# Patient Record
Sex: Female | Born: 1995 | Race: Black or African American | Hispanic: No | Marital: Single | Smoking: Never smoker
Health system: Southern US, Community
[De-identification: ages and names within clinical notes are randomized; demographics above are authoritative.]

## PROBLEM LIST (undated history)

## (undated) DIAGNOSIS — J45909 Unspecified asthma, uncomplicated: Secondary | ICD-10-CM

## (undated) DIAGNOSIS — J302 Other seasonal allergic rhinitis: Secondary | ICD-10-CM

---

## 2013-09-26 ENCOUNTER — Encounter (HOSPITAL_COMMUNITY): Payer: Self-pay | Admitting: Emergency Medicine

## 2013-09-26 DIAGNOSIS — Z79899 Other long term (current) drug therapy: Secondary | ICD-10-CM | POA: Insufficient documentation

## 2013-09-26 DIAGNOSIS — IMO0002 Reserved for concepts with insufficient information to code with codable children: Secondary | ICD-10-CM | POA: Insufficient documentation

## 2013-09-26 DIAGNOSIS — N3090 Cystitis, unspecified without hematuria: Secondary | ICD-10-CM | POA: Insufficient documentation

## 2013-09-26 DIAGNOSIS — J45909 Unspecified asthma, uncomplicated: Secondary | ICD-10-CM | POA: Insufficient documentation

## 2013-09-26 DIAGNOSIS — N390 Urinary tract infection, site not specified: Secondary | ICD-10-CM | POA: Insufficient documentation

## 2013-09-26 DIAGNOSIS — Z3202 Encounter for pregnancy test, result negative: Secondary | ICD-10-CM | POA: Insufficient documentation

## 2013-09-26 LAB — COMPREHENSIVE METABOLIC PANEL
ALBUMIN: 4 g/dL (ref 3.5–5.2)
ALT: 17 U/L (ref 0–35)
AST: 20 U/L (ref 0–37)
Alkaline Phosphatase: 56 U/L (ref 39–117)
BUN: 8 mg/dL (ref 6–23)
CALCIUM: 9.4 mg/dL (ref 8.4–10.5)
CO2: 28 mEq/L (ref 19–32)
Chloride: 103 mEq/L (ref 96–112)
Creatinine, Ser: 0.73 mg/dL (ref 0.50–1.10)
GFR calc Af Amer: >90 mL/min (ref >90)
GFR calc non Af Amer: >90 mL/min (ref >90)
Glucose, Bld: 113 mg/dL — ABNORMAL HIGH (ref 70–99)
Potassium: 3.7 mEq/L (ref 3.7–5.3)
SODIUM: 142 meq/L (ref 137–147)
TOTAL PROTEIN: 7.1 g/dL (ref 6.0–8.3)
Total Bilirubin: 0.6 mg/dL (ref 0.3–1.2)

## 2013-09-26 LAB — URINE MICROSCOPIC-ADD ON

## 2013-09-26 LAB — URINALYSIS, ROUTINE W REFLEX MICROSCOPIC
Bilirubin Urine: NEGATIVE
Glucose, UA: NEGATIVE mg/dL
Ketones, ur: NEGATIVE mg/dL
NITRITE: NEGATIVE
Protein, ur: >300 mg/dL — AB
SPECIFIC GRAVITY, URINE: 1.019 (ref 1.005–1.030)
UROBILINOGEN UA: 1 mg/dL (ref 0.0–1.0)
pH: 6.5 (ref 5.0–8.0)

## 2013-09-26 LAB — CBC WITH DIFFERENTIAL/PLATELET
BASOS ABS: 0 10*3/uL (ref 0.0–0.1)
Basophils Relative: 0 % (ref 0–1)
EOS PCT: 2 % (ref 0–5)
Eosinophils Absolute: 0.2 10*3/uL (ref 0.0–0.7)
HCT: 37.8 % (ref 36.0–46.0)
Hemoglobin: 12.4 g/dL (ref 12.0–15.0)
LYMPHS PCT: 14 % (ref 12–46)
Lymphs Abs: 1.1 10*3/uL (ref 0.7–4.0)
MCH: 27.9 pg (ref 26.0–34.0)
MCHC: 32.8 g/dL (ref 30.0–36.0)
MCV: 84.9 fL (ref 78.0–100.0)
Monocytes Absolute: 0.6 10*3/uL (ref 0.1–1.0)
Monocytes Relative: 7 % (ref 3–12)
Neutro Abs: 6 10*3/uL (ref 1.7–7.7)
Neutrophils Relative %: 76 % (ref 43–77)
PLATELETS: 450 10*3/uL — AB (ref 150–400)
RBC: 4.45 MIL/uL (ref 3.87–5.11)
RDW: 12.7 % (ref 11.5–15.5)
WBC: 7.9 10*3/uL (ref 4.0–10.5)

## 2013-09-26 LAB — POC URINE PREG, ED: Preg Test, Ur: NEGATIVE

## 2013-09-26 NOTE — ED Notes (Signed)
Pt. reports dysuria/hematuria with low abdominal cramping onset today , denies nausea/vomitting or diarrhea . No fever or chills.

## 2013-09-27 ENCOUNTER — Emergency Department (HOSPITAL_COMMUNITY)
Admission: EM | Admit: 2013-09-27 | Discharge: 2013-09-27 | Disposition: A | Discharge status: Home / Self Care | Payer: Self-pay | Attending: Emergency Medicine | Admitting: Emergency Medicine

## 2013-09-27 DIAGNOSIS — N390 Urinary tract infection, site not specified: Secondary | ICD-10-CM

## 2013-09-27 DIAGNOSIS — N3091 Cystitis, unspecified with hematuria: Secondary | ICD-10-CM

## 2013-09-27 HISTORY — DX: Unspecified asthma, uncomplicated: J45.909

## 2013-09-27 HISTORY — DX: Other seasonal allergic rhinitis: J30.2

## 2013-09-27 MED ORDER — CEPHALEXIN 250 MG PO CAPS
500.0000 mg | ORAL_CAPSULE | Freq: Once | ORAL | Status: AC
  Administered 2013-09-27: 500 mg via ORAL
  Filled 2013-09-27: qty 2

## 2013-09-27 MED ORDER — CEPHALEXIN 500 MG PO CAPS: 500.0000 mg | ORAL_CAPSULE | Freq: Two times a day (BID) | ORAL | Status: DC

## 2013-09-27 NOTE — ED Provider Notes (Signed)
Medical screening examination/treatment/procedure(s) were performed by non-physician practitioner and as supervising physician I was immediately available for consultation/collaboration.   EKG Interpretation None        Stevana Dufner L Berthe Oley, MD 09/27/13 0659 

## 2013-09-27 NOTE — ED Provider Notes (Signed)
CSN: [id number]     Arrival date & time 09/26/13  2017 History   First MD Initiated Contact with Patient 09/27/13 0141     Chief Complaint  Patient presents with  . Dysuria     (Consider location/radiation/quality/duration/timing/severity/associated sxs/prior Treatment) HPI Comments: Patient is a 18 year old female with no significant past medical history who presents today for dysuria and hematuria with onset yesterday afternoon. Patient has been taking probiotics and drinking cranberry juice without relief. She states that she experiences burning discomfort at the "end of the urethra" with each void. She states that she noticed blood in her urine at symptom onset which has persisted without clots. Symptoms associated with suprapubic cramping which is intermittent and nonradiating. Patient denies associated fever, back pain, nausea, vomiting, diarrhea, vaginal bleeding, vaginal discharge, pelvic pain, and chills. LMP 09/07/2013.  Patient is a 18 y.o. female presenting with dysuria. The history is provided by the patient. No language interpreter was used.  Dysuria Associated symptoms: abdominal pain (suprapubic)   Associated symptoms: no fever, no flank pain, no nausea and no vomiting     Past Medical History  Diagnosis Date  . Asthma   . Seasonal allergies    History reviewed. No pertinent past surgical history. No family history on file. History  Substance Use Topics  . Smoking status: Never Smoker   . Smokeless tobacco: Not on file  . Alcohol Use: No   OB History   Grav Para Term Preterm Abortions TAB SAB Ect Mult Living                 Review of Systems  Constitutional: Negative for fever.  Gastrointestinal: Positive for abdominal pain (suprapubic). Negative for nausea, vomiting and diarrhea.  Genitourinary: Positive for dysuria and hematuria. Negative for flank pain.  Musculoskeletal: Negative for back pain.  All other systems reviewed and are  negative.      Allergies  Review of patient's allergies indicates no known allergies.  Home Medications   Current Outpatient Rx  Name  Route  Sig  Dispense  Refill  . albuterol (PROVENTIL HFA;VENTOLIN HFA) 108 (90 BASE) MCG/ACT inhaler   Inhalation   Inhale 2 puffs into the lungs every 6 (six) hours as needed for wheezing or shortness of breath.         Marland Kitchen. HYDROCORTISONE PO   Oral   Take 1 application by mouth 2 (two) times daily. ECZEMA         . cephALEXin (KEFLEX) 500 MG capsule   Oral   Take 1 capsule (500 mg total) by mouth 2 (two) times daily.   14 capsule   0    BP 116/73  Pulse 75  Temp(Src) 98.9 F (37.2 C) (Oral)  Resp 16  Ht 5\' 7"  (1.702 m)  Wt 132 lb (59.875 kg)  BMI 20.67 kg/m2  SpO2 100%  LMP 09/07/2013  Physical Exam  Nursing note and vitals reviewed. Constitutional: She is oriented to person, place, and time. She appears well-developed and well-nourished. No distress.  Patient nontoxic and nonseptic appearing. She is in no visible or audible discomfort.  HENT:  Head: Normocephalic and atraumatic.  Mouth/Throat: Oropharynx is clear and moist. No oropharyngeal exudate.  Eyes: Conjunctivae and EOM are normal. No scleral icterus.  Neck: Normal range of motion.  Cardiovascular: Normal rate, regular rhythm and normal heart sounds.   Pulmonary/Chest: Effort normal and breath sounds normal. No respiratory distress. She has no wheezes. She has no rales.  Abdominal: Soft. She exhibits no  distension. There is tenderness (mild with deep palpation to suprapubic region). There is no rebound and no guarding.  No peritoneal signs. No CVA tenderness.  Musculoskeletal: Normal range of motion.  Neurological: She is alert and oriented to person, place, and time.  GCS 15. Speech is goal oriented.  Skin: Skin is warm and dry. No rash noted. She is not diaphoretic. No erythema. No pallor.  Psychiatric: She has a normal mood and affect. Her behavior is normal.     ED Course  Procedures (including critical care time) Labs Review Labs Reviewed  URINALYSIS, ROUTINE W REFLEX MICROSCOPIC - Abnormal; Notable for the following:    APPearance CLOUDY (*)    Hgb urine dipstick LARGE (*)    Protein, ur >300 (*)    Leukocytes, UA MODERATE (*)    All other components within normal limits  CBC WITH DIFFERENTIAL - Abnormal; Notable for the following:    Platelets 450 (*)    All other components within normal limits  COMPREHENSIVE METABOLIC PANEL - Abnormal; Notable for the following:    Glucose, Bld 113 (*)    All other components within normal limits  URINE MICROSCOPIC-ADD ON - Abnormal; Notable for the following:    Squamous Epithelial / LPF FEW (*)    All other components within normal limits  URINE CULTURE  POC URINE PREG, ED   Imaging Review No results found.   EKG Interpretation None      MDM   Final diagnoses:  UTI (urinary tract infection)  Hemorrhagic cystitis    Pt has been diagnosed with a UTI. Symptom onset yesterday afternoon. Pt is afebrile, no CVA tenderness, normotensive, and denies N/V and vaginal complaints. No evidence of peritonitis. No leukocytosis. Kidney function preserved. Pt to be dc home with antibiotics and instructions to follow up with PCP if symptoms persist. She has been given her first dose of abx in ED prior to d/c.   Filed Vitals:   09/26/13 2043 09/27/13 0107  BP: 120/64 116/73  Pulse: 96 75  Temp: 99.3 F (37.4 C) 98.9 F (37.2 C)  TempSrc: Oral Oral  Resp: 14 16  Height: 5\' 7"  (1.702 m)   Weight: 132 lb (59.875 kg)   SpO2: 100% 100%        Antony Madura, PA-C 09/27/13 0207

## 2013-09-27 NOTE — Discharge Instructions (Signed)
Urinary Tract Infection  Urinary tract infections (UTIs) can develop anywhere along your urinary tract. Your urinary tract is your body's drainage system for removing wastes and extra water. Your urinary tract includes two kidneys, two ureters, a bladder, and a urethra. Your kidneys are a pair of bean-shaped organs. Each kidney is about the size of your fist. They are located below your ribs, one on each side of your spine.  CAUSES  Infections are caused by microbes, which are microscopic organisms, including fungi, viruses, and bacteria. These organisms are so small that they can only be seen through a microscope. Bacteria are the microbes that most commonly cause UTIs.  SYMPTOMS   Symptoms of UTIs may vary by age and gender of the patient and by the location of the infection. Symptoms in young women typically include a frequent and intense urge to urinate and a painful, burning feeling in the bladder or urethra during urination. Older women and men are more likely to be tired, shaky, and weak and have muscle aches and abdominal pain. A fever may mean the infection is in your kidneys. Other symptoms of a kidney infection include pain in your back or sides below the ribs, nausea, and vomiting.  DIAGNOSIS  To diagnose a UTI, your caregiver will ask you about your symptoms. Your caregiver also will ask to provide a urine sample. The urine sample will be tested for bacteria and white blood cells. White blood cells are made by your body to help fight infection.  TREATMENT   Typically, UTIs can be treated with medication. Because most UTIs are caused by a bacterial infection, they usually can be treated with the use of antibiotics. The choice of antibiotic and length of treatment depend on your symptoms and the type of bacteria causing your infection.  HOME CARE INSTRUCTIONS   If you were prescribed antibiotics, take them exactly as your caregiver instructs you. Finish the medication even if you feel better after you  have only taken some of the medication.   Drink enough water and fluids to keep your urine clear or pale yellow.   Avoid caffeine, tea, and carbonated beverages. They tend to irritate your bladder.   Empty your bladder often. Avoid holding urine for long periods of time.   Empty your bladder before and after sexual intercourse.   After a bowel movement, women should cleanse from front to back. Use each tissue only once.  SEEK MEDICAL CARE IF:    You have back pain.   You develop a fever.   Your symptoms do not begin to resolve within 3 days.  SEEK IMMEDIATE MEDICAL CARE IF:    You have severe back pain or lower abdominal pain.   You develop chills.   You have nausea or vomiting.   You have continued burning or discomfort with urination.  MAKE SURE YOU:    Understand these instructions.   Will watch your condition.   Will get help right away if you are not doing well or get worse.  Document Released: 04/02/2005 Document Revised: 12/23/2011 Document Reviewed: 08/01/2011  [employer] Patient Information 2014 [employer].

## 2013-09-28 LAB — URINE CULTURE: Colony Count: 15000

## 2014-11-28 ENCOUNTER — Emergency Department (HOSPITAL_COMMUNITY): Admission: EM | Admit: 2014-11-28 | Discharge: 2014-11-28 | Discharge status: Against Medical Advice | Payer: Self-pay

## 2014-11-28 ENCOUNTER — Emergency Department (HOSPITAL_COMMUNITY)
Admission: EM | Admit: 2014-11-28 | Discharge: 2014-11-28 | Disposition: A | Discharge status: Home / Self Care | Payer: Self-pay | Attending: Emergency Medicine | Admitting: Emergency Medicine

## 2014-11-28 ENCOUNTER — Encounter (HOSPITAL_COMMUNITY): Payer: Self-pay | Admitting: Physical Medicine and Rehabilitation

## 2014-11-28 DIAGNOSIS — Y9389 Activity, other specified: Secondary | ICD-10-CM | POA: Insufficient documentation

## 2014-11-28 DIAGNOSIS — S01511A Laceration without foreign body of lip, initial encounter: Secondary | ICD-10-CM | POA: Insufficient documentation

## 2014-11-28 DIAGNOSIS — Z79899 Other long term (current) drug therapy: Secondary | ICD-10-CM | POA: Insufficient documentation

## 2014-11-28 DIAGNOSIS — Y998 Other external cause status: Secondary | ICD-10-CM | POA: Insufficient documentation

## 2014-11-28 DIAGNOSIS — J45909 Unspecified asthma, uncomplicated: Secondary | ICD-10-CM | POA: Insufficient documentation

## 2014-11-28 DIAGNOSIS — Y9289 Other specified places as the place of occurrence of the external cause: Secondary | ICD-10-CM | POA: Insufficient documentation

## 2014-11-28 MED ORDER — LIDOCAINE HCL (PF) 1 % IJ SOLN
5.0000 mL | Freq: Once | INTRAMUSCULAR | Status: AC
  Administered 2014-11-28: 5 mL
  Filled 2014-11-28: qty 5
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
## 2014-11-28 NOTE — ED Notes (Signed)
Pt calledx2, no answer 

## 2014-11-28 NOTE — ED Notes (Addendum)
Pt called x1. No answer. 

## 2014-11-28 NOTE — Discharge Instructions (Signed)
Facial Laceration ° A facial laceration is a cut on the face. These injuries can be painful and cause bleeding. Lacerations usually heal quickly, but they need special care to reduce scarring. °DIAGNOSIS  °Your health care provider will take a medical history, ask for details about how the injury occurred, and examine the wound to determine how deep the cut is. °TREATMENT  °Some facial lacerations may not require closure. Others may not be able to be closed because of an increased risk of infection. The risk of infection and the chance for successful closure will depend on various factors, including the amount of time since the injury occurred. °The wound may be cleaned to help prevent infection. If closure is appropriate, pain medicines may be given if needed. Your health care provider will use stitches (sutures), wound glue (adhesive), or skin adhesive strips to repair the laceration. These tools bring the skin edges together to allow for faster healing and a better cosmetic outcome. If needed, you may also be given a tetanus shot. °HOME CARE INSTRUCTIONS °· Only take over-the-counter or prescription medicines as directed by your health care provider. °· Follow your health care provider's instructions for wound care. These instructions will vary depending on the technique used for closing the wound. °For Sutures: °· Keep the wound clean and dry.   °· If you were given a bandage (dressing), you should change it at least once a day. Also change the dressing if it becomes wet or dirty, or as directed by your health care provider.   °· Wash the wound with soap and water 2 times a day. Rinse the wound off with water to remove all soap. Pat the wound dry with a clean towel.   °· After cleaning, apply a thin layer of the antibiotic ointment recommended by your health care provider. This will help prevent infection and keep the dressing from sticking.   °· You may shower as usual after the first 24 hours. Do not soak the  wound in water until the sutures are removed.   °· Get your sutures removed as directed by your health care provider. With facial lacerations, sutures should usually be taken out after 4-5 days to avoid stitch marks.   °· Wait a few days after your sutures are removed before applying any makeup. °For Skin Adhesive Strips: °· Keep the wound clean and dry.   °· Do not get the skin adhesive strips wet. You may bathe carefully, using caution to keep the wound dry.   °· If the wound gets wet, pat it dry with a clean towel.   °· Skin adhesive strips will fall off on their own. You may trim the strips as the wound heals. Do not remove skin adhesive strips that are still stuck to the wound. They will fall off in time.   °For Wound Adhesive: °· You may briefly wet your wound in the shower or bath. Do not soak or scrub the wound. Do not swim. Avoid periods of heavy sweating until the skin adhesive has fallen off on its own. After showering or bathing, gently pat the wound dry with a clean towel.   °· Do not apply liquid medicine, cream medicine, ointment medicine, or makeup to your wound while the skin adhesive is in place. This may loosen the film before your wound is healed.   °· If a dressing is placed over the wound, be careful not to apply tape directly over the skin adhesive. This may cause the adhesive to be pulled off before the wound is healed.   °· Avoid   prolonged exposure to sunlight or tanning lamps while the skin adhesive is in place.  The skin adhesive will usually remain in place for 5-10 days, then naturally fall off the skin. Do not pick at the adhesive film.  After Healing: Once the wound has healed, cover the wound with sunscreen during the day for 1 full year. This can help minimize scarring. Exposure to ultraviolet light in the first year will darken the scar. It can take 1-2 years for the scar to lose its redness and to heal completely.  SEEK IMMEDIATE MEDICAL CARE IF:  You have redness, pain, or  swelling around the wound.   You see ayellowish-white fluid (pus) coming from the wound.   You have chills or a fever.  MAKE SURE YOU:  Understand these instructions.  Will watch your condition.  Will get help right away if you are not doing well or get worse. Document Released: 07/31/2004 Document Revised: 04/13/2013 Document Reviewed: 02/03/2013 [facility] Patient Information 2015 [place], [place]. This information is not intended to replace advice given to you by your health care provider. Make sure you discuss any questions you have with your health care provider.  Mouth Laceration A mouth laceration is a cut inside the mouth. TREATMENT  Because of all the bacteria in the mouth, lacerations are usually not stitched (sutured) unless the wound is gaping open. Sometimes, a couple sutures may be placed just to hold the edges of the wound together and to speed healing. Over the next 1 to 2 days, you will see that the wound edges appear gray in color. The edges may appear ragged and slightly spread apart. Because of all the normal bacteria in the mouth, these wounds are contaminated, but this is not an infection that needs antibiotics. Most wounds heal with no problems despite their appearance. HOME CARE INSTRUCTIONS   Rinse your mouth with a warm, saltwater wash 4 to 6 times per day, or as your caregiver instructs.  Continue oral hygiene and gentle tooth brushing as normal, if possible.  Do not eat or drink hot food or beverages while your mouth is still numb.  Eat a bland diet to avoid irritation from acidic foods.  Only take over-the-counter or prescription medicines for pain, discomfort, or fever as directed by your caregiver.  Follow up with your caregiver as instructed. You may need to see your caregiver for a wound check in 48 to 72 hours to make sure your wound is healing.  If your laceration was sutured, do not play with the sutures or knots with your tongue. If you do this,  they will gradually loosen and may become untied. You may need a tetanus shot if:  You cannot remember when you had your last tetanus shot.  You have never had a tetanus shot. If you get a tetanus shot, your arm may swell, get red, and feel warm to the touch. This is common and not a problem. If you need a tetanus shot and you choose not to have one, there is a rare chance of getting tetanus. Sickness from tetanus can be serious. SEEK MEDICAL CARE IF:   You develop swelling or increasing pain in the wound or in other parts of your face.  You have a fever.  You develop swollen, tender glands in the throat.  You notice the wound edges do not stay together after your sutures have been removed.  You see pus coming from the wound. Some drainage in the mouth is normal. MAKE SURE  YOU:   Understand these instructions.  Will watch your condition.  Will get help right away if you are not doing well or get worse. Document Released: 06/23/2005 Document Revised: 09/15/2011 Document Reviewed: 12/26/2010 [facility] Patient Information 2015 [place], [place]. This information is not intended to replace advice given to you by your health care provider. Make sure you discuss any questions you have with your health care provider.
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
## 2014-12-05 ENCOUNTER — Encounter (HOSPITAL_COMMUNITY): Payer: Self-pay | Admitting: Emergency Medicine

## 2014-12-05 ENCOUNTER — Emergency Department (HOSPITAL_COMMUNITY)
Admission: EM | Admit: 2014-12-05 | Discharge: 2014-12-05 | Disposition: A | Discharge status: Home / Self Care | Payer: Self-pay | Attending: Emergency Medicine | Admitting: Emergency Medicine

## 2014-12-05 DIAGNOSIS — J45909 Unspecified asthma, uncomplicated: Secondary | ICD-10-CM | POA: Insufficient documentation

## 2014-12-05 DIAGNOSIS — Z4802 Encounter for removal of sutures: Secondary | ICD-10-CM | POA: Insufficient documentation

## 2014-12-05 DIAGNOSIS — Z79899 Other long term (current) drug therapy: Secondary | ICD-10-CM | POA: Insufficient documentation

## 2014-12-05 NOTE — ED Notes (Signed)
Pt here to have sutures removed from lip. Wound in well approximated. No signs of infection.

## 2014-12-05 NOTE — ED Provider Notes (Signed)
CSN: [id number]     Arrival date & time 12/05/14  1504 History  This chart was scribed for non-physician practitioner, Emilia Beck, PA-C, working with Rolland Porter, MD, by Lionel December, ED Scribe. This patient was seen in room TR06C/TR06C and the patient's care was started at 3:38 PM.    Chief Complaint  Patient presents with  . Suture / Staple Removal     (Consider location/radiation/quality/duration/timing/severity/associated sxs/prior Treatment) HPI HPI Comments: Kim Adkins is a 19 y.o. female who presents to the Emergency Department for suture removal from the left side of her lip which were put in a week ago.    Patient has no other questions or concerns today.       Past Medical History  Diagnosis Date  . Asthma   . Seasonal allergies    History reviewed. No pertinent past surgical history. No family history on file. History  Substance Use Topics  . Smoking status: Never Smoker   . Smokeless tobacco: Not on file  . Alcohol Use: No   OB History    No data available     Review of Systems  Skin: Positive for wound.  All other systems reviewed and are negative.     Allergies  Review of patient's allergies indicates no known allergies.  Home Medications   Prior to Admission medications   Medication Sig Start Date End Date Taking? Authorizing Provider  albuterol (PROVENTIL HFA;VENTOLIN HFA) 108 (90 BASE) MCG/ACT inhaler Inhale 2 puffs into the lungs every 6 (six) hours as needed for wheezing or shortness of breath.    Historical Provider, MD   BP 99/61 mmHg  Pulse 62  Temp(Src) 98 F (36.7 C) (Oral)  Resp 16  Ht  (1.702 m)  Wt 139 lb (63.05 kg)  BMI 21.77 kg/m2  SpO2 98%  LMP 10/25/2014 Physical Exam  Constitutional: She is oriented to person, place, and time. She appears well-developed and well-nourished. No distress.  HENT:  Head: Normocephalic and atraumatic.  Eyes: Conjunctivae and EOM are normal.  Cardiovascular: Normal rate and  regular rhythm.  Exam reveals no gallop and no friction rub.   No murmur heard. Pulmonary/Chest: Effort normal and breath sounds normal. No respiratory distress. She has no wheezes. She has no rales. She exhibits no tenderness.  Abdominal: Soft. There is no tenderness.  Musculoskeletal: Normal range of motion.  Neurological: She is alert and oriented to person, place, and time.  Speech is goal-oriented. Moves limbs without ataxia.   Skin: Skin is warm and dry.  Healed laceration above left upper lip with sutures intact.   Psychiatric: She has a normal mood and affect. Her behavior is normal.  Nursing note and vitals reviewed.   ED Course  Procedures (including critical care time) DIAGNOSTIC STUDIES: Oxygen Saturation is 98% on RA, normal by my interpretation.    COORDINATION OF CARE: 3:41 PM Discussed treatment plan with patient at beside, the patient agrees with the plan and has no further questions at this time.  SUTURE REMOVAL Performed by: Emilia Beck  Consent: Verbal consent obtained. Patient identity confirmed: provided demographic data Time out: Immediately prior to procedure a "time out" was called to verify the correct patient, procedure, equipment, support staff and site/side marked as required.  Location details: above left upper lip  Wound Appearance: clean  Sutures/Staples Removed: 2  Facility: sutures placed in this facility Patient tolerance: Patient tolerated the procedure well with no immediate complications.     Labs Review Labs Reviewed - No  data to display  Imaging Review No results found.   EKG Interpretation None      MDM   Final diagnoses:  Visit for suture removal   3:43 PM Sutures removed without difficulty.   I personally performed the services described in this documentation, which was scribed in my presence. The recorded information has been reviewed and is accurate.    Emilia BeckKaitlyn Jadin Kagel, PA-C 12/05/14 1544  Rolland PorterMark  James, MD 12/13/14 [phone]

## 2015-05-28 ENCOUNTER — Emergency Department (HOSPITAL_COMMUNITY)
Admission: EM | Admit: 2015-05-28 | Discharge: 2015-05-28 | Disposition: A | Discharge status: Home / Self Care | Payer: No Typology Code available for payment source | Attending: Emergency Medicine | Admitting: Emergency Medicine

## 2015-05-28 ENCOUNTER — Encounter (HOSPITAL_COMMUNITY): Payer: Self-pay | Admitting: *Deleted

## 2015-05-28 DIAGNOSIS — Z79899 Other long term (current) drug therapy: Secondary | ICD-10-CM | POA: Diagnosis not present

## 2015-05-28 DIAGNOSIS — Y92410 Unspecified street and highway as the place of occurrence of the external cause: Secondary | ICD-10-CM | POA: Diagnosis not present

## 2015-05-28 DIAGNOSIS — S199XXA Unspecified injury of neck, initial encounter: Secondary | ICD-10-CM | POA: Diagnosis not present

## 2015-05-28 DIAGNOSIS — Y998 Other external cause status: Secondary | ICD-10-CM | POA: Insufficient documentation

## 2015-05-28 DIAGNOSIS — Y9389 Activity, other specified: Secondary | ICD-10-CM | POA: Insufficient documentation

## 2015-05-28 DIAGNOSIS — J45909 Unspecified asthma, uncomplicated: Secondary | ICD-10-CM | POA: Insufficient documentation

## 2015-05-28 MED ORDER — IBUPROFEN 400 MG PO TABS: 400.0000 mg | ORAL_TABLET | Freq: Four times a day (QID) | ORAL | Status: AC | PRN
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
## 2015-10-26 ENCOUNTER — Emergency Department (HOSPITAL_COMMUNITY)
Admission: EM | Admit: 2015-10-26 | Discharge: 2015-10-26 | Disposition: A | Discharge status: Home / Self Care | Payer: No Typology Code available for payment source | Attending: Emergency Medicine | Admitting: Emergency Medicine

## 2015-10-26 ENCOUNTER — Encounter (HOSPITAL_COMMUNITY): Payer: Self-pay | Admitting: Emergency Medicine

## 2015-10-26 DIAGNOSIS — Z79899 Other long term (current) drug therapy: Secondary | ICD-10-CM | POA: Insufficient documentation

## 2015-10-26 DIAGNOSIS — L03114 Cellulitis of left upper limb: Secondary | ICD-10-CM

## 2015-10-26 DIAGNOSIS — J45909 Unspecified asthma, uncomplicated: Secondary | ICD-10-CM | POA: Insufficient documentation

## 2015-10-26 MED ORDER — CEPHALEXIN 500 MG PO CAPS: 500.0000 mg | ORAL_CAPSULE | Freq: Two times a day (BID) | ORAL | Status: AC

## 2015-10-26 NOTE — ED Provider Notes (Signed)
CSN: [id number]     Arrival date & time 10/26/15  2242 History  By signing my name below, I, Linus GalasMaharshi Patel, attest that this documentation has been prepared under the direction and in the presence of Audry Piliyler Kunal Levario, PA-C. Electronically Signed: Linus GalasMaharshi Patel, ED Scribe. 10/26/2015. 11:07 PM.  Chief Complaint  Patient presents with  . Insect Bite   The history is provided by the patient. No language interpreter was used.   HPI Comments: Kim Adkins is a [age] y.o. female who presents to the Emergency Department complaining of insect bite to her left forearm that was noted yesterday. She notes increased redness and itching since. Pt notes pain with movement. None now. She applied OTC topical Benadryl with no relief. Pt denies any fevers, chills, N/V/D or any other symptoms at this time.   Past Medical History  Diagnosis Date  . Asthma   . Seasonal allergies    History reviewed. No pertinent past surgical history. History reviewed. No pertinent family history. Social History  Substance Use Topics  . Smoking status: Never Smoker   . Smokeless tobacco: None  . Alcohol Use: Yes     Comment: social   OB History    No data available     Review of Systems A complete 10 system review of systems was obtained and all systems are negative except as noted in the HPI and PMH.   Allergies  Lactose intolerance (gi)  Home Medications   Prior to Admission medications   Medication Sig Start Date End Date Taking? Authorizing Provider  albuterol (PROVENTIL HFA;VENTOLIN HFA) 108 (90 BASE) MCG/ACT inhaler Inhale 2 puffs into the lungs every 6 (six) hours as needed for wheezing or shortness of breath.    Historical Provider, MD  aspirin-acetaminophen-caffeine (EXCEDRIN MIGRAINE) [phone] MG tablet Take 1 tablet by mouth every 6 (six) hours as needed for headache.    Historical Provider, MD  ibuprofen (ADVIL,MOTRIN) 400 MG tablet Take 1 tablet (400 mg total) by mouth every 6 (six) hours as needed.  05/28/15   Joycie PeekBenjamin Cartner, PA-C   BP 112/73 mmHg  Pulse 64  Temp(Src) 98.3 F (36.8 C) (Oral)  Resp 18  Ht 5\' 7"  (1.702 m)  Wt 150 lb (68.04 kg)  BMI 23.49 kg/m2  SpO2 100%   Physical Exam  Constitutional: She is oriented to person, place, and time. She appears well-developed and well-nourished.  HENT:  Head: Normocephalic and atraumatic.  Eyes: EOM are normal.  Neck: Normal range of motion.  Cardiovascular: Normal rate and regular rhythm.   Pulmonary/Chest: Effort normal.  Abdominal: Soft. She exhibits no distension.  Musculoskeletal: Normal range of motion.  Neurological: She is alert and oriented to person, place, and time.  Skin: Skin is warm and dry.  Cellulitic rash noted on left forearm. <21mm site of induration noted. Blanchable. No signs of purulent drainage.    Psychiatric: She has a normal mood and affect. Her behavior is normal. Thought content normal.  Nursing note and vitals reviewed.  ED Course  Procedures  DIAGNOSTIC STUDIES: Oxygen Saturation is 100% on room air, normal by my interpretation.    COORDINATION OF CARE: 11:03 PM Discussed treatment plan with pt at bedside and pt agreed to plan.  Labs Review Labs Reviewed - No data to display  Imaging Review No results found. I have personally reviewed and evaluated these images and lab results as part of my medical decision-making.   EKG Interpretation None      MDM  I have reviewed  the relevant previous healthcare records. I obtained HPI from historian.  ED Course:  Assessment: Pt is a 20yF who presents with cellulitic rash on left forearm. On exam, pt in NAD. Nontoxic/nonseptic appearing. VSS. Afebrile. Plan is to DC home with abx. Counseled on warm compresses to affected area. DC with follow up with PCP for further management x 1 week. At time of discharge, Patient is in no acute distress. Vital Signs are stable. Patient is able to ambulate. Patient able to tolerate PO.   Disposition/Plan:  DC  Home Additional Verbal discharge instructions given and discussed with patient.  Pt Instructed to f/u with PCP in the next week for evaluation and treatment of symptoms. Return precautions given Pt acknowledges and agrees with plan  Supervising Physician Loren Racer, MD   Final diagnoses:  Cellulitis of left upper extremity   I personally performed the services described in this documentation, which was scribed in my presence. The recorded information has been reviewed and is accurate.    Audry Pili, PA-C 10/26/15 2313  Loren Racer, MD 10/27/15 2350

## 2015-10-26 NOTE — ED Notes (Signed)
Pt presents to ED for "bug bite" on left forearm that has begun to develop cellulitis.  Pt c/o tingling to the arm as well.  Insect bite occurred yesterday.  Area of redness covers approx 2x4in area.

## 2015-10-26 NOTE — ED Notes (Signed)
Pt is afebrile.

## 2015-10-26 NOTE — Discharge Instructions (Signed)
Please read and follow all provided instructions.  Your diagnoses today include:  1. Cellulitis of left upper extremity    Tests performed today include:  Vital signs. See below for your results today.   Medications prescribed:   Take any prescribed medications only as directed.   Home care instructions:  Follow any educational materials contained in this packet. Keep affected area above the level of your heart when possible. Wash area gently twice a day with warm soapy water. Do not apply alcohol or hydrogen peroxide. Cover the area if it draining or weeping.   Follow-up instructions:  Return to the Emergency Department in 48 hours for a recheck if your symptoms are not significantly improved.   Please follow-up with your primary care provider in the next 1 week for further evaluation of your symptoms.   Return instructions:  Return to the Emergency Department if you have:  Fever  Worsening symptoms  Worsening pain  Worsening swelling  Redness of the skin that moves away from the affected area, especially if it streaks away from the affected area   Any other emergent concerns  Your vital signs today were: BP 112/73 mmHg   Pulse 64   Temp(Src) 98.3 F (36.8 C) (Oral)   Resp 18   Ht 5\' 7"  (1.702 m)   Wt 68.04 kg   BMI 23.49 kg/m2   SpO2 100% If your blood pressure (BP) was elevated above 135/85 this visit, please have this repeated by your doctor within one month. --------------

## 2015-10-26 NOTE — ED Notes (Signed)
PA at bedside.

## 2015-10-26 NOTE — ED Notes (Signed)
Pt ambulatory and independent at discharge.  

## 2016-03-03 ENCOUNTER — Encounter (HOSPITAL_COMMUNITY): Payer: Self-pay | Admitting: *Deleted

## 2016-03-03 DIAGNOSIS — R102 Pelvic and perineal pain: Secondary | ICD-10-CM | POA: Insufficient documentation

## 2016-03-03 DIAGNOSIS — N760 Acute vaginitis: Secondary | ICD-10-CM | POA: Insufficient documentation

## 2016-03-03 DIAGNOSIS — J45909 Unspecified asthma, uncomplicated: Secondary | ICD-10-CM | POA: Insufficient documentation

## 2016-03-03 DIAGNOSIS — B9689 Other specified bacterial agents as the cause of diseases classified elsewhere: Secondary | ICD-10-CM | POA: Insufficient documentation

## 2016-03-03 DIAGNOSIS — Z79899 Other long term (current) drug therapy: Secondary | ICD-10-CM | POA: Insufficient documentation

## 2016-03-03 DIAGNOSIS — Z7982 Long term (current) use of aspirin: Secondary | ICD-10-CM | POA: Insufficient documentation

## 2016-03-03 LAB — URINALYSIS, ROUTINE W REFLEX MICROSCOPIC
Bilirubin Urine: NEGATIVE
GLUCOSE, UA: NEGATIVE mg/dL
HGB URINE DIPSTICK: NEGATIVE
Ketones, ur: NEGATIVE mg/dL
Leukocytes, UA: NEGATIVE
Nitrite: NEGATIVE
PROTEIN: NEGATIVE mg/dL
Specific Gravity, Urine: 1.026 (ref 1.005–1.030)
pH: 6.5 (ref 5.0–8.0)

## 2016-03-03 LAB — CBC
HCT: 42.2 % (ref 36.0–46.0)
Hemoglobin: 12.9 g/dL (ref 12.0–15.0)
MCH: 26.7 pg (ref 26.0–34.0)
MCHC: 30.6 g/dL (ref 30.0–36.0)
MCV: 87.2 fL (ref 78.0–100.0)
PLATELETS: 489 10*3/uL — AB (ref 150–400)
RBC: 4.84 MIL/uL (ref 3.87–5.11)
RDW: 13 % (ref 11.5–15.5)
WBC: 4.6 10*3/uL (ref 4.0–10.5)

## 2016-03-03 LAB — COMPREHENSIVE METABOLIC PANEL
ALT: 15 U/L (ref 14–54)
AST: 20 U/L (ref 15–41)
Albumin: 4.7 g/dL (ref 3.5–5.0)
Alkaline Phosphatase: 65 U/L (ref 38–126)
Anion gap: 5 (ref 5–15)
BUN: 12 mg/dL (ref 6–20)
CO2: 26 mmol/L (ref 22–32)
CREATININE: 0.63 mg/dL (ref 0.44–1.00)
Calcium: 9.9 mg/dL (ref 8.9–10.3)
Chloride: 103 mmol/L (ref 101–111)
GFR calc Af Amer: >60 mL/min (ref >60)
GLUCOSE: 105 mg/dL — AB (ref 65–99)
Potassium: 4.1 mmol/L (ref 3.5–5.1)
Sodium: 134 mmol/L — ABNORMAL LOW (ref 135–145)
Total Bilirubin: 0.6 mg/dL (ref 0.3–1.2)
Total Protein: 7.5 g/dL (ref 6.5–8.1)

## 2016-03-03 LAB — POC URINE PREG, ED: Preg Test, Ur: NEGATIVE

## 2016-03-03 LAB — LIPASE, BLOOD: LIPASE: 32 U/L (ref 11–51)

## 2016-03-03 NOTE — ED Triage Notes (Signed)
Pt c/o abdominal cramping for a week. Pt also c/o spotting and discharge yesterday. LMP July 23. Pt denies n/v/d

## 2016-03-04 ENCOUNTER — Emergency Department (HOSPITAL_COMMUNITY): Payer: Self-pay

## 2016-03-04 ENCOUNTER — Emergency Department (HOSPITAL_COMMUNITY)
Admission: EM | Admit: 2016-03-04 | Discharge: 2016-03-04 | Disposition: A | Discharge status: Home / Self Care | Payer: Self-pay | Attending: Emergency Medicine | Admitting: Emergency Medicine

## 2016-03-04 DIAGNOSIS — B9689 Other specified bacterial agents as the cause of diseases classified elsewhere: Secondary | ICD-10-CM

## 2016-03-04 DIAGNOSIS — R102 Pelvic and perineal pain: Secondary | ICD-10-CM

## 2016-03-04 DIAGNOSIS — N760 Acute vaginitis: Secondary | ICD-10-CM

## 2016-03-04 DIAGNOSIS — R1030 Lower abdominal pain, unspecified: Secondary | ICD-10-CM

## 2016-03-04 LAB — GC/CHLAMYDIA PROBE AMP (~~LOC~~) NOT AT ARMC
CHLAMYDIA, DNA PROBE: NEGATIVE
NEISSERIA GONORRHEA: NEGATIVE

## 2016-03-04 LAB — WET PREP, GENITAL
SPERM: NONE SEEN
TRICH WET PREP: NONE SEEN
YEAST WET PREP: NONE SEEN

## 2016-03-04 MED ORDER — METRONIDAZOLE 500 MG PO TABS: 500.0000 mg | ORAL_TABLET | Freq: Two times a day (BID) | ORAL | 0 refills | Status: AC

## 2016-03-04 MED ORDER — NAPROXEN 500 MG PO TABS: 500.0000 mg | ORAL_TABLET | Freq: Two times a day (BID) | ORAL | 0 refills | Status: AC

## 2016-03-04 NOTE — ED Notes (Signed)
Patient transported to Ultrasound 

## 2016-03-04 NOTE — ED Provider Notes (Signed)
[facility]-EMERGENCY DEPT Provider Note   CSN: [id number] Arrival date & time: 03/03/16  1803     History   Chief Complaint Chief Complaint  Patient presents with  . Abdominal Pain    HPI Kim Adkins is a 20 y.o. female.  HPI Kim Adkins is a 20 y.o. female presents to emergency department complaining of lower abdominal pain. Patient states her pain started several days ago. She reports pain in suprapubic area, right lower quadrant, states pain radiates into her vaginal area. She reports pain with intercourse. She reports some vaginal spotting and white vaginal discharge. Taking ibuprofen for pain with no relief. Denies nausea, vomiting. No urinary symptoms. States has irregular periods, but usually does not have spotting. Not sure of pregnancy. Nothing making her symptoms better. No other complaints.   Past Medical History:  Diagnosis Date  . Asthma   . Seasonal allergies     There are no active problems to display for this patient.   History reviewed. No pertinent surgical history.  OB History    No data available       Home Medications    Prior to Admission medications   Medication Sig Start Date End Date Taking? Authorizing Provider  albuterol (PROVENTIL HFA;VENTOLIN HFA) 108 (90 BASE) MCG/ACT inhaler Inhale 2 puffs into the lungs every 6 (six) hours as needed for wheezing or shortness of breath.    Historical Provider, MD  aspirin-acetaminophen-caffeine (EXCEDRIN MIGRAINE) [phone] MG tablet Take 1 tablet by mouth every 6 (six) hours as needed for headache.    Historical Provider, MD  cephALEXin (KEFLEX) 500 MG capsule Take 1 capsule (500 mg total) by mouth 2 (two) times daily. 10/26/15   Audry Piliyler Mohr, PA-C  ibuprofen (ADVIL,MOTRIN) 400 MG tablet Take 1 tablet (400 mg total) by mouth every 6 (six) hours as needed. 05/28/15   Joycie PeekBenjamin Cartner, PA-C    Family History History reviewed. No pertinent family history.  Social History Social History  Substance Use Topics    . Smoking status: Never Smoker  . Smokeless tobacco: Never Used  . Alcohol use Yes     Comment: social     Allergies   Lactose intolerance (gi)   Review of Systems Review of Systems  Constitutional: Negative for chills and fever.  Respiratory: Negative for cough, chest tightness and shortness of breath.   Cardiovascular: Negative for chest pain, palpitations and leg swelling.  Gastrointestinal: Positive for abdominal pain. Negative for diarrhea, nausea and vomiting.  Genitourinary: Positive for pelvic pain, vaginal bleeding and vaginal discharge. Negative for dysuria, flank pain and vaginal pain.  Musculoskeletal: Negative for arthralgias, myalgias, neck pain and neck stiffness.  Skin: Negative for rash.  Neurological: Negative for dizziness, weakness and headaches.  All other systems reviewed and are negative.    Physical Exam Updated Vital Signs BP 108/73 (BP Location: Right Arm)   Pulse (!) 58   Temp 98.5 F (36.9 C) (Oral)   Resp 16   Ht 5' 7.5" (1.715 m)   Wt 71.3 kg   SpO2 100%   BMI 24.26 kg/m   Physical Exam  Constitutional: She appears well-developed and well-nourished. No distress.  HENT:  Head: Normocephalic.  Eyes: Conjunctivae are normal.  Neck: Neck supple.  Cardiovascular: Normal rate, regular rhythm and normal heart sounds.   Pulmonary/Chest: Effort normal and breath sounds normal. No respiratory distress. She has no wheezes. She has no rales.  Abdominal: Soft. Bowel sounds are normal. She exhibits no distension. There is tenderness. There is no  rebound.  RLQ tenderness, suprapubic tenderness. No cva tenderness  Genitourinary:  Genitourinary Comments: Normal external genitalia. Normal vaginal canal. Small thin white discharge. Cervix is normal, closed. No CMT. No uterine or adnexal tenderness. No masses palpated.    Musculoskeletal: She exhibits no edema.  Neurological: She is alert.  Skin: Skin is warm and dry.  Psychiatric: She has a normal  mood and affect. Her behavior is normal.  Nursing note and vitals reviewed.    ED Treatments / Results  Labs (all labs ordered are listed, but only abnormal results are displayed) Labs Reviewed  WET PREP, GENITAL - Abnormal; Notable for the following:       Result Value   Clue Cells Wet Prep HPF POC PRESENT (*)    WBC, Wet Prep HPF POC MODERATE (*)    All other components within normal limits  COMPREHENSIVE METABOLIC PANEL - Abnormal; Notable for the following:    Sodium 134 (*)    Glucose, Bld 105 (*)    All other components within normal limits  CBC - Abnormal; Notable for the following:    Platelets 489 (*)    All other components within normal limits  URINALYSIS, ROUTINE W REFLEX MICROSCOPIC (NOT AT [facility]) - Abnormal; Notable for the following:    APPearance HAZY (*)    All other components within normal limits  LIPASE, BLOOD  POC URINE PREG, ED  GC/CHLAMYDIA PROBE AMP ([place]) NOT AT [facility]    EKG  EKG Interpretation None       Radiology US Transvaginal Non-ob  Result Date: 03/04/2016 CLINICAL DATA:  Abdominal cramping for 1 week with spotting yesterday. Negative pregnancy test. EXAM: TRANSABDOMINAL AND TRANSVAGINAL ULTRASOUND OF PELVIS DOPPLER ULTRASOUND OF OVARIES TECHNIQUE: Both transabdominal and transvaginal ultrasound examinations of the pelvis were performed. Transabdominal technique was performed for global imaging of the pelvis including uterus, ovaries, adnexal regions, and pelvic cul-de-sac. It was necessary to proceed with endovaginal exam following the transabdominal exam to visualize the endometrium and ovaries. Color and duplex Doppler ultrasound was utilized to evaluate blood flow to the ovaries. COMPARISON:  None. FINDINGS: Uterus Measurements: 8.3 x 3.3 x 4.7 cm. Uterus is anteverted. No fibroids or other mass visualized. Small nabothian cysts in the cervix. Endometrium Thickness: 9 mm.  No focal abnormality visualized. Right ovary Measurements: 3.6 x  2.1 x 2.7 cm. Normal follicular changes. Normal appearance/no adnexal mass. Left ovary Measurements: 3.6 x 2.4 x 2.9 cm. Normal follicular changes. Normal appearance/no adnexal mass. Pulsed Doppler evaluation of both ovaries demonstrates normal low-resistance arterial and venous waveforms. Flow is demonstrated within both ovaries on color flow Doppler imaging. Other findings No abnormal free fluid. IMPRESSION: Normal ultrasound appearance of the uterus and ovaries. No evidence of ovarian mass or torsion. Electronically Signed   By: Burman Nieves M.D.   On: 03/04/2016 02:40   US Pelvis Complete  Result Date: 03/04/2016 CLINICAL DATA:  Abdominal cramping for 1 week with spotting yesterday. Negative pregnancy test. EXAM: TRANSABDOMINAL AND TRANSVAGINAL ULTRASOUND OF PELVIS DOPPLER ULTRASOUND OF OVARIES TECHNIQUE: Both transabdominal and transvaginal ultrasound examinations of the pelvis were performed. Transabdominal technique was performed for global imaging of the pelvis including uterus, ovaries, adnexal regions, and pelvic cul-de-sac. It was necessary to proceed with endovaginal exam following the transabdominal exam to visualize the endometrium and ovaries. Color and duplex Doppler ultrasound was utilized to evaluate blood flow to the ovaries. COMPARISON:  None. FINDINGS: Uterus Measurements: 8.3 x 3.3 x 4.7 cm. Uterus is anteverted. No fibroids  or other mass visualized. Small nabothian cysts in the cervix. Endometrium Thickness: 9 mm.  No focal abnormality visualized. Right ovary Measurements: 3.6 x 2.1 x 2.7 cm. Normal follicular changes. Normal appearance/no adnexal mass. Left ovary Measurements: 3.6 x 2.4 x 2.9 cm. Normal follicular changes. Normal appearance/no adnexal mass. Pulsed Doppler evaluation of both ovaries demonstrates normal low-resistance arterial and venous waveforms. Flow is demonstrated within both ovaries on color flow Doppler imaging. Other findings No abnormal free fluid. IMPRESSION:  Normal ultrasound appearance of the uterus and ovaries. No evidence of ovarian mass or torsion. Electronically Signed   By: Burman Nieves M.D.   On: 03/04/2016 02:40   [place] Art/ven Flow Abd Pelv Doppler  Result Date: 03/04/2016 CLINICAL DATA:  Abdominal cramping for 1 week with spotting yesterday. Negative pregnancy test. EXAM: TRANSABDOMINAL AND TRANSVAGINAL ULTRASOUND OF PELVIS DOPPLER ULTRASOUND OF OVARIES TECHNIQUE: Both transabdominal and transvaginal ultrasound examinations of the pelvis were performed. Transabdominal technique was performed for global imaging of the pelvis including uterus, ovaries, adnexal regions, and pelvic cul-de-sac. It was necessary to proceed with endovaginal exam following the transabdominal exam to visualize the endometrium and ovaries. Color and duplex Doppler ultrasound was utilized to evaluate blood flow to the ovaries. COMPARISON:  None. FINDINGS: Uterus Measurements: 8.3 x 3.3 x 4.7 cm. Uterus is anteverted. No fibroids or other mass visualized. Small nabothian cysts in the cervix. Endometrium Thickness: 9 mm.  No focal abnormality visualized. Right ovary Measurements: 3.6 x 2.1 x 2.7 cm. Normal follicular changes. Normal appearance/no adnexal mass. Left ovary Measurements: 3.6 x 2.4 x 2.9 cm. Normal follicular changes. Normal appearance/no adnexal mass. Pulsed Doppler evaluation of both ovaries demonstrates normal low-resistance arterial and venous waveforms. Flow is demonstrated within both ovaries on color flow Doppler imaging. Other findings No abnormal free fluid. IMPRESSION: Normal ultrasound appearance of the uterus and ovaries. No evidence of ovarian mass or torsion. Electronically Signed   By: Burman Nieves M.D.   On: 03/04/2016 02:40    Procedures Procedures (including critical care time)  Medications Ordered in ED Medications - No data to display   Initial Impression / Assessment and Plan / ED Course  I have reviewed the triage vital signs and the  nursing notes.  Pertinent labs & imaging results that were available during my care of the patient were reviewed by me and considered in my medical decision making (see chart for details).  Clinical Course    1:53 AM Pt seen and examined. Pt with right adnexal tendereness for several days, worse tonight. Will get pelvic exam, [place] to ro toa and torsion. Labs normal.   3:43 AM Pelvic exam unremarkable. Ultrasound negative. Clue cells present on wet prep, will treat with Flagyl. Patient does not appear to be in any distress. Able to tolerate oral fluids. Plan to discharge home with naproxen and Flagyl, follow up with primary care doctor. Given pain for several weeks, doubt appendicitis or any other acute intra-abdominal process.  Vitals:   03/04/16 0114 03/04/16 0130 03/04/16 0145 03/04/16 0250  BP: 108/73 109/73 113/81 124/61  Pulse: (!) 58 (!) 52 60 86  Resp: 16   16  Temp:      TempSrc:      SpO2: 100% 99% 100% 99%  Weight:      Height:        Final Clinical Impressions(s) / ED Diagnoses   Final diagnoses:  Adnexal pain  Adnexal pain  Adnexal pain    New Prescriptions New Prescriptions  METRONIDAZOLE (FLAGYL) 500 MG TABLET    Take 1 tablet (500 mg total) by mouth 2 (two) times daily.   NAPROXEN (NAPROSYN) 500 MG TABLET    Take 1 tablet (500 mg total) by mouth 2 (two) times daily.     Jaynie Crumble, PA-C 03/04/16 1610    Shon Baton, MD 03/04/16 2248

## 2016-03-04 NOTE — Discharge Instructions (Signed)
Technical proximal and is prescribed as needed for pain and inflammation. Take Flagyl as prescribed until all gone. Follow-up with your doctor. Return for worsening symptoms.

## 2016-05-12 ENCOUNTER — Emergency Department (HOSPITAL_COMMUNITY)
Admission: EM | Admit: 2016-05-12 | Discharge: 2016-05-12 | Disposition: A | Discharge status: Home / Self Care | Payer: Self-pay | Attending: Emergency Medicine | Admitting: Emergency Medicine

## 2016-05-12 ENCOUNTER — Encounter (HOSPITAL_COMMUNITY): Payer: Self-pay

## 2016-05-12 DIAGNOSIS — J45909 Unspecified asthma, uncomplicated: Secondary | ICD-10-CM | POA: Insufficient documentation

## 2016-05-12 DIAGNOSIS — R110 Nausea: Secondary | ICD-10-CM

## 2016-05-12 DIAGNOSIS — R112 Nausea with vomiting, unspecified: Secondary | ICD-10-CM | POA: Insufficient documentation

## 2016-05-12 DIAGNOSIS — Z79899 Other long term (current) drug therapy: Secondary | ICD-10-CM | POA: Insufficient documentation

## 2016-05-12 MED ORDER — ONDANSETRON HCL 4 MG PO TABS: 4.0000 mg | ORAL_TABLET | Freq: Four times a day (QID) | ORAL | 0 refills | Status: AC

## 2016-05-12 MED ORDER — RANITIDINE HCL 150 MG PO TABS: 150.0000 mg | ORAL_TABLET | Freq: Two times a day (BID) | ORAL | 0 refills | Status: AC

## 2016-05-12 MED ORDER — ONDANSETRON 4 MG PO TBDP
4.0000 mg | ORAL_TABLET | Freq: Once | ORAL | Status: AC
  Administered 2016-05-12: 4 mg via ORAL
  Filled 2016-05-12: qty 1

## 2016-05-12 NOTE — ED Notes (Signed)
Patient states no emesis at this time but remains nauseated.

## 2016-05-12 NOTE — ED Provider Notes (Signed)
[facility]EMER[facility]Y DEPT Provider Note   CSN: [id number] Arrival date & time: 05/12/16  1521  By signing my name below, I, Soijett Blue, attest that this documentation has been prepared under the direction and in the presence of Kerrie BuffaloHope Neese, NP Electronically Signed: Soijett Blue, ED Scribe. 05/12/16. 4:38 PM.   History   Chief Complaint Chief Complaint  Patient presents with  . Allergic Reaction    HPI  Kim Adkins is a 20 y.o. Adkins who presents to the Emergency Department complaining of allergic reaction onset today PTA. Pt notes that she is lactose intolerant and she unknowingly ate cheese today at work at approximately noon. Pt states that the last time she had an allergic reaction due to dairy products was 10 years ago and she had hives as her reaction. Pt reports that she has had a cold prior to the onset of her symptoms that is mildly resolving. Pt is having associated symptoms of resolved vomiting x 1 episode, fever, chills, cough, and nasal congestion. She notes that she has tried OTC medications for the relief of her symptoms. She denies rash, color change, and any other symptoms.    The history is provided by the patient. No language interpreter was used.  Allergic Reaction  Presenting symptoms: no difficulty breathing, no difficulty swallowing, no itching and no rash   Severity:  Mild Duration:  5 hours Prior allergic episodes:  No prior episodes (lactose intolerance 10 years ago) Context: dairy/milk products   Relieved by: OTC cold and flu medications. Worsened by:  Nothing Ineffective treatments:  None tried   Past Medical History:  Diagnosis Date  . Asthma   . Seasonal allergies     There are no active problems to display for this patient.   History reviewed. No pertinent surgical history.  OB History    No data available       Home Medications    Prior to Admission medications   Medication Sig Start Date End Date Taking? Authorizing Provider    cephALEXin (KEFLEX) 500 MG capsule Take 1 capsule (500 mg total) by mouth 2 (two) times daily. Patient not taking: Reported on 03/04/2016 10/26/15   Audry Piliyler Mohr, PA-C  ibuprofen (ADVIL,MOTRIN) 400 MG tablet Take 1 tablet (400 mg total) by mouth every 6 (six) hours as needed. Patient not taking: Reported on 03/04/2016 05/28/15   Joycie PeekBenjamin Cartner, PA-C  metroNIDAZOLE (FLAGYL) 500 MG tablet Take 1 tablet (500 mg total) by mouth 2 (two) times daily. 03/04/16   Tatyana Kirichenko, PA-C  naproxen (NAPROSYN) 500 MG tablet Take 1 tablet (500 mg total) by mouth 2 (two) times daily. 03/04/16   Tatyana Kirichenko, PA-C  ondansetron (ZOFRAN) 4 MG tablet Take 1 tablet (4 mg total) by mouth every 6 (six) hours. 05/12/16   Hope Orlene OchM Neese, NP  ranitidine (ZANTAC) 150 MG tablet Take 1 tablet (150 mg total) by mouth 2 (two) times daily. 05/12/16   Hope Orlene OchM Neese, NP    Family History History reviewed. No pertinent family history.  Social History Social History  Substance Use Topics  . Smoking status: Never Smoker  . Smokeless tobacco: Never Used  . Alcohol use Yes     Comment: social     Allergies   Lactose intolerance (gi)   Review of Systems Review of Systems  Constitutional: Positive for chills and fever (subjective).  HENT: Positive for congestion. Negative for trouble swallowing.   Respiratory: Positive for cough.   Gastrointestinal: Positive for vomiting (resolved).  Skin: Negative for  color change, itching and rash.     Physical Exam Updated Vital Signs BP 103/72 (BP Location: Right Arm)   Pulse (!) 56   Temp 98.2 F (36.8 C) (Oral)   Resp 18   Ht 5\' 8"  (1.727 m)   Wt 70.3 kg   LMP 05/12/2016   SpO2 98%   BMI 23.57 kg/m   Physical Exam  Constitutional: She is oriented to person, place, and time. She appears well-developed and well-nourished. No distress.  HENT:  Head: Normocephalic and atraumatic.  Right Ear: Tympanic membrane, external ear and ear canal normal.  Left Ear: Tympanic  membrane, external ear and ear canal normal.  Nose: Rhinorrhea present.  Mouth/Throat: Uvula is midline, oropharynx is clear and moist and mucous membranes are normal. No posterior oropharyngeal edema or posterior oropharyngeal erythema.  Uvula midline. No posterior oropharyngeal edema. TMs normal.  Eyes: Conjunctivae and EOM are normal. Pupils are equal, round, and reactive to light. No scleral icterus.  Sclera clear.   Neck: Normal range of motion. Neck supple.  Cardiovascular: Normal rate and regular rhythm.   Pulmonary/Chest: Effort normal and breath sounds normal.  Abdominal: Soft. Bowel sounds are normal. There is no tenderness.  Musculoskeletal: Normal range of motion.  Lymphadenopathy:    She has no cervical adenopathy.  Neurological: She is alert and oriented to person, place, and time.  Skin: Skin is warm and dry. No rash noted.  Psychiatric: She has a normal mood and affect. Her behavior is normal.  Nursing note and vitals reviewed.    ED Treatments / Results  DIAGNOSTIC STUDIES: Oxygen Saturation is 98% on RA, nl by my interpretation.    COORDINATION OF CARE: 4:35 PM Discussed treatment plan with pt at bedside which includes zofran, PO challenge, and pt agreed to plan.   Procedures Procedures (including critical care time)  Medications Ordered in ED Medications  ondansetron (ZOFRAN-ODT) disintegrating tablet 4 mg (4 mg Oral Given 05/12/16 1637)     Initial Impression / Assessment and Plan / ED Course  I have reviewed the triage vital signs and the nursing notes.   Clinical Course    20 y.o. Adkins with one episode of n/v after eating food containing cheese 5 hours prior to arrival to the ED. Symptoms have resolved and patient stable for d/c without respiratory symptoms, rash n/v. Will treat for nausea and give Zantac Rx.  Final Clinical Impressions(s) / ED Diagnoses   Final diagnoses:  Nausea    New Prescriptions Discharge Medication List as of 05/12/2016   4:37 PM    START taking these medications   Details  ondansetron (ZOFRAN) 4 MG tablet Take 1 tablet (4 mg total) by mouth every 6 (six) hours., Starting Mon 05/12/2016, Print    ranitidine (ZANTAC) 150 MG tablet Take 1 tablet (150 mg total) by mouth 2 (two) times daily., Starting Mon 05/12/2016, Print       I personally performed the services described in this documentation, which was scribed in my presence. The recorded information has been reviewed and is accurate.     AlohaHope M Neese, NP 05/12/16 1955    Rolland PorterMark James, MD 06/03/16 [phone]

## 2016-05-12 NOTE — Discharge Instructions (Signed)
Continue to take your medication with the Benadryl. Return for worsening symptoms.

## 2016-05-12 NOTE — ED Triage Notes (Signed)
Pt states at work and ate something. Pt states is lactose intolerant. Pt states ate cheese. Pt states emesis post ingestion. Pt denies any SOB or respiratory issues. Pt with NO hives or itching at triage. Pt a/o x 4, NAD.

## 2016-05-12 NOTE — ED Notes (Signed)
Declined W/C at D/C and was escorted to lobby by RN. 

## 2017-06-16 IMAGING — US US PELVIS COMPLETE
1 series · 13 of 25 positions shown · non-contrast
Comparison: None.

CLINICAL DATA: Abdominal cramping for 1 week with spotting
yesterday. Negative pregnancy test.

EXAM:
TRANSABDOMINAL AND TRANSVAGINAL ULTRASOUND OF PELVIS
DOPPLER ULTRASOUND OF OVARIES
TECHNIQUE: Both transabdominal and transvaginal ultrasound examinations of the
pelvis were performed. Transabdominal technique was performed for
global imaging of the pelvis including uterus, ovaries, adnexal
regions, and pelvic cul-de-sac.
It was necessary to proceed with endovaginal exam following the
transabdominal exam to visualize the endometrium and ovaries. Color
and duplex Doppler ultrasound was utilized to evaluate blood flow to
the ovaries.

[Series 1: us pelvis complete · 0.22mm/px · 13 of 53 slices shown]
[im 1/53]
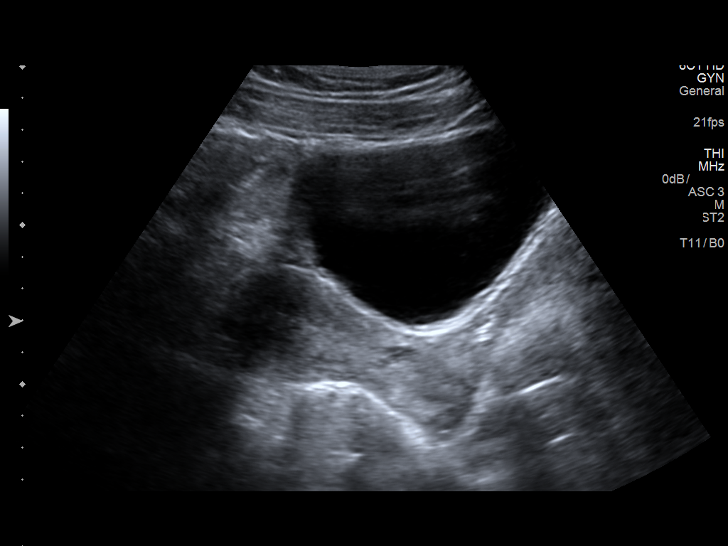
[im 5/53]
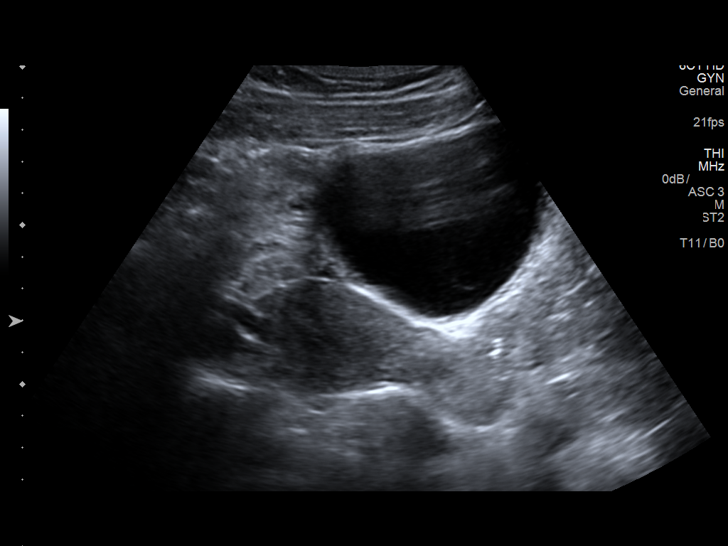
[im 9/53]
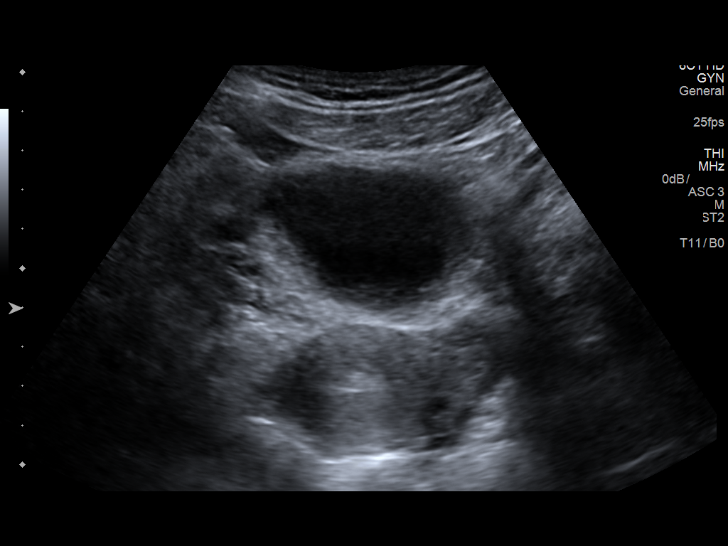
[im 14/53]
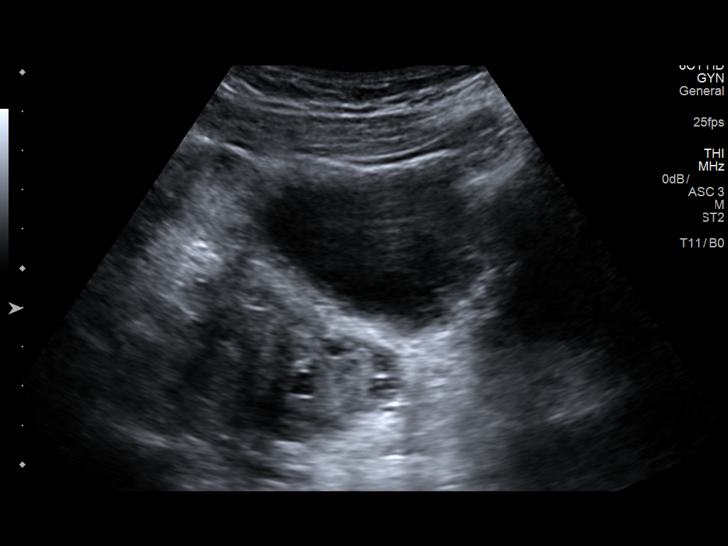
[im 18/53]
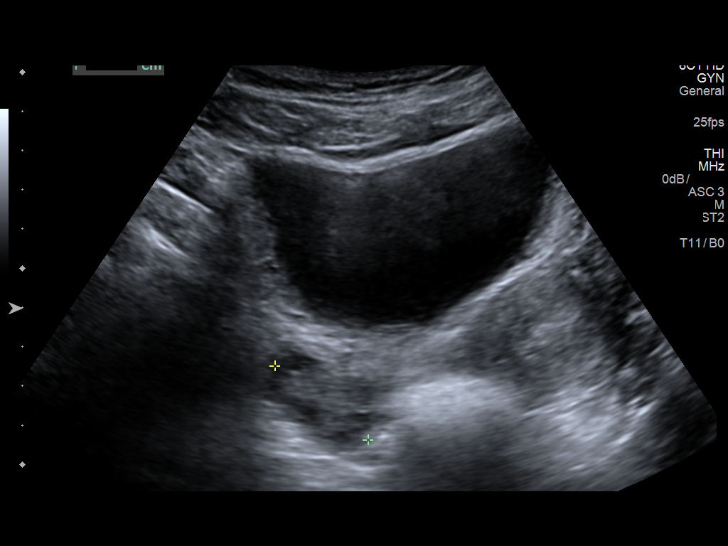
[im 22/53]
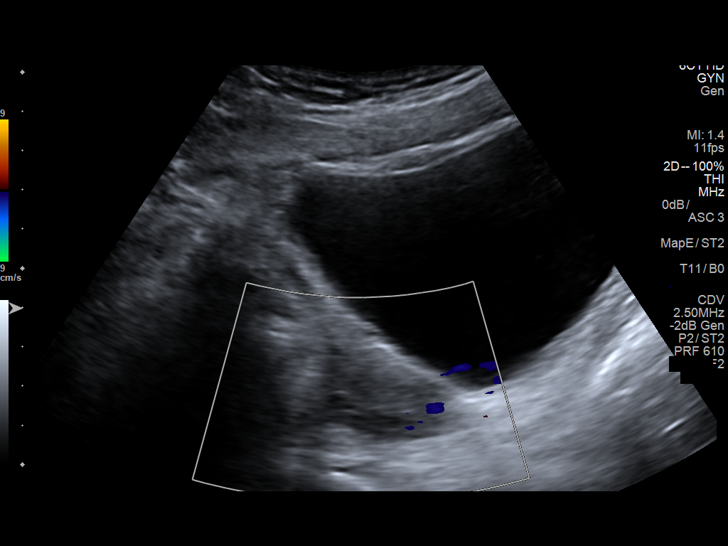
[im 27/53]
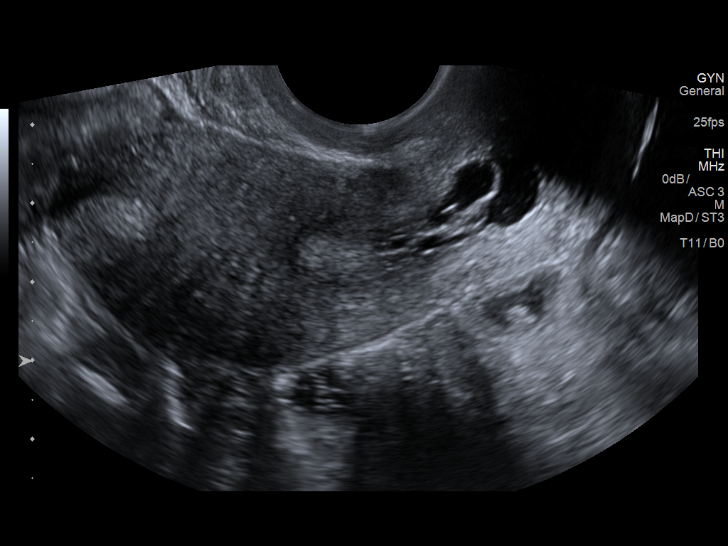
[im 31/53]
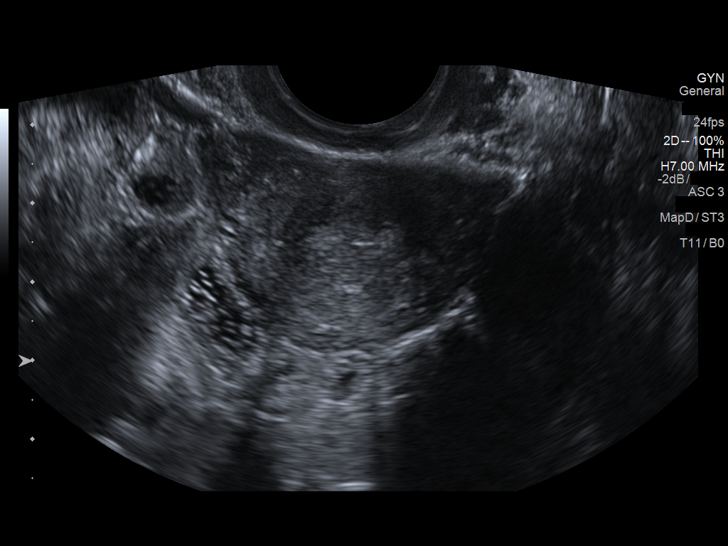
[im 35/53]
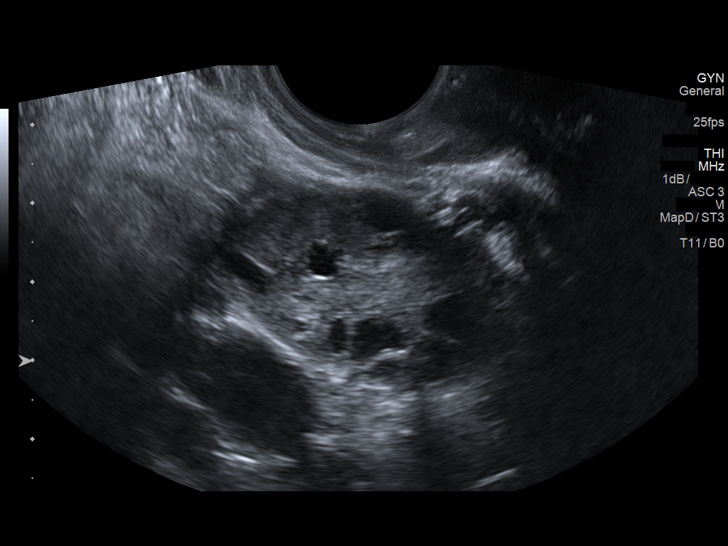
[im 40/53]
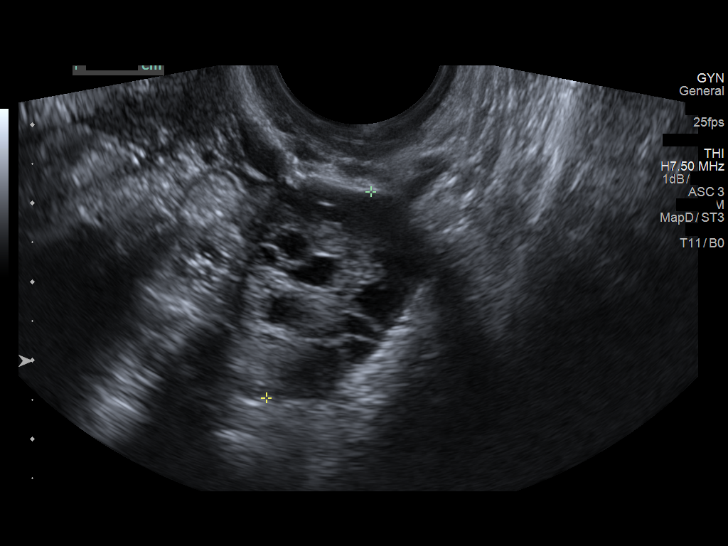
[im 44/53]
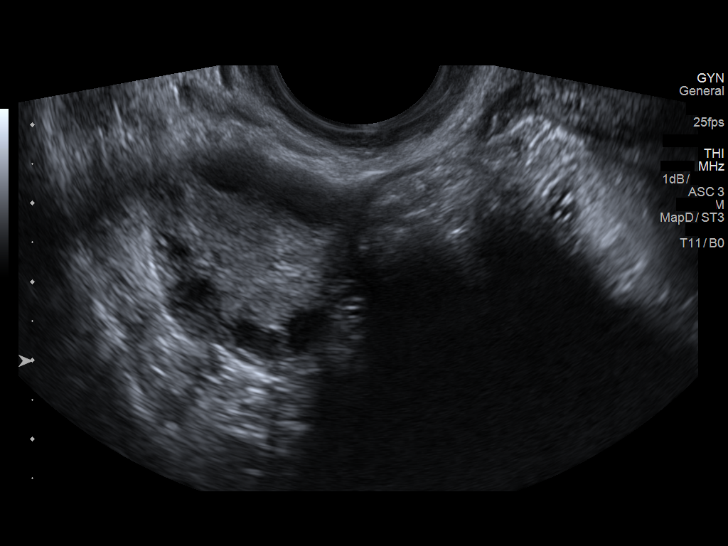
[im 48/53]
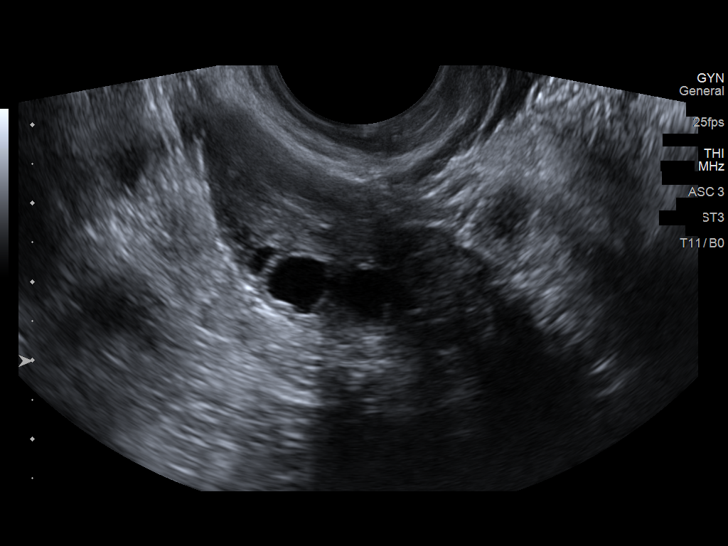
[im 53/53]
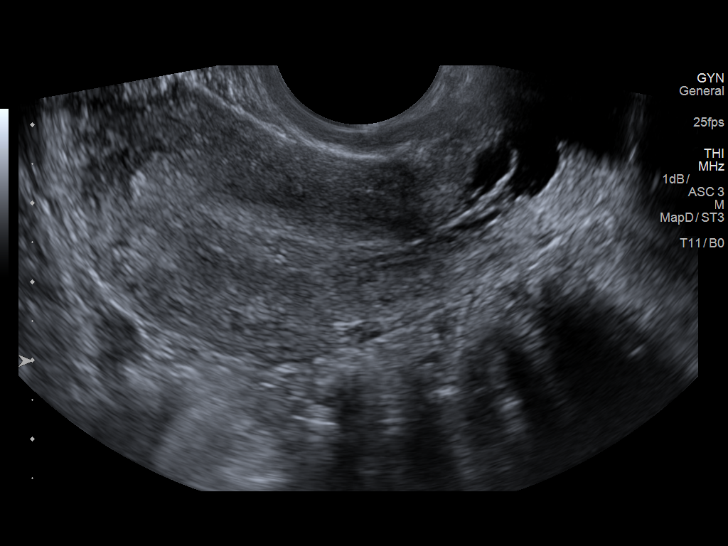

[13 of 25 positions shown; findings below may reference images not displayed]

FINDINGS: Uterus

Measurements: 8.3 x 3.3 x 4.7 cm. Uterus is anteverted. No fibroids
or other mass visualized. Small nabothian cysts in the cervix.

Endometrium

Thickness: 9 mm.  No focal abnormality visualized.

Right ovary

Measurements: 3.6 x 2.1 x 2.7 cm. Normal follicular changes.. Normal
appearance/no adnexal mass.

Left ovary

Measurements: 3.6 x 2.4 x 2.9 cm. Normal follicular changes. Normal
appearance/no adnexal mass.

Pulsed Doppler evaluation of both ovaries demonstrates normal
low-resistance arterial and venous waveforms. Flow is demonstrated
within both ovaries on color flow Doppler imaging.

Other findings

No abnormal free fluid.
IMPRESSION: Normal ultrasound appearance of the uterus and ovaries. No evidence
of ovarian mass or torsion.
